# Patient Record
Sex: Female | Born: 1965 | Race: Black or African American | Hispanic: No | Marital: Married | Smoking: Never smoker
Health system: Southern US, Community
[De-identification: ages and names within clinical notes are randomized; demographics above are authoritative.]

## PROBLEM LIST (undated history)

## (undated) DIAGNOSIS — M5430 Sciatica, unspecified side: Secondary | ICD-10-CM

## (undated) DIAGNOSIS — G4730 Sleep apnea, unspecified: Secondary | ICD-10-CM

## (undated) DIAGNOSIS — I10 Essential (primary) hypertension: Secondary | ICD-10-CM

## (undated) HISTORY — PX: NO PAST SURGERIES: SHX2092

---

## 1997-08-29 ENCOUNTER — Other Ambulatory Visit: Admission: RE | Admit: 1997-08-29 | Discharge: 1997-08-29 | Payer: Self-pay | Admitting: Obstetrics & Gynecology

## 1997-08-29 ENCOUNTER — Other Ambulatory Visit: Admission: RE | Admit: 1997-08-29 | Discharge: 1997-08-29 | Payer: Self-pay

## 1998-09-14 ENCOUNTER — Emergency Department (HOSPITAL_COMMUNITY): Admission: EM | Admit: 1998-09-14 | Discharge: 1998-09-14 | Payer: Self-pay | Admitting: Emergency Medicine

## 1998-10-11 ENCOUNTER — Other Ambulatory Visit: Admission: RE | Admit: 1998-10-11 | Discharge: 1998-10-11 | Payer: Self-pay | Admitting: Obstetrics and Gynecology

## 1999-10-18 ENCOUNTER — Other Ambulatory Visit: Admission: RE | Admit: 1999-10-18 | Discharge: 1999-10-18 | Payer: Self-pay | Admitting: Family Medicine

## 2000-12-15 ENCOUNTER — Other Ambulatory Visit: Admission: RE | Admit: 2000-12-15 | Discharge: 2000-12-15 | Payer: Self-pay | Admitting: Family Medicine

## 2001-12-24 ENCOUNTER — Other Ambulatory Visit: Admission: RE | Admit: 2001-12-24 | Discharge: 2001-12-24 | Payer: Self-pay | Admitting: Obstetrics and Gynecology

## 2003-01-10 ENCOUNTER — Other Ambulatory Visit: Admission: RE | Admit: 2003-01-10 | Discharge: 2003-01-10 | Payer: Self-pay | Admitting: Obstetrics and Gynecology

## 2003-02-08 ENCOUNTER — Emergency Department (HOSPITAL_COMMUNITY): Admission: EM | Admit: 2003-02-08 | Discharge: 2003-02-08 | Payer: Self-pay | Admitting: Emergency Medicine

## 2003-07-26 ENCOUNTER — Ambulatory Visit (HOSPITAL_COMMUNITY): Admission: RE | Admit: 2003-07-26 | Discharge: 2003-07-26 | Payer: Self-pay | Admitting: Family Medicine

## 2004-04-23 ENCOUNTER — Other Ambulatory Visit: Admission: RE | Admit: 2004-04-23 | Discharge: 2004-04-23 | Payer: Self-pay | Admitting: Obstetrics and Gynecology

## 2004-09-05 ENCOUNTER — Ambulatory Visit (HOSPITAL_BASED_OUTPATIENT_CLINIC_OR_DEPARTMENT_OTHER): Admission: RE | Admit: 2004-09-05 | Discharge: 2004-09-05 | Payer: Self-pay | Admitting: Family Medicine

## 2004-09-09 ENCOUNTER — Ambulatory Visit: Payer: Self-pay | Admitting: Internal Medicine

## 2005-05-06 ENCOUNTER — Emergency Department (HOSPITAL_COMMUNITY): Admission: EM | Admit: 2005-05-06 | Discharge: 2005-05-07 | Payer: Self-pay | Admitting: Emergency Medicine

## 2005-05-20 ENCOUNTER — Other Ambulatory Visit: Admission: RE | Admit: 2005-05-20 | Discharge: 2005-05-20 | Payer: Self-pay | Admitting: Obstetrics and Gynecology

## 2005-06-18 ENCOUNTER — Encounter: Admission: RE | Admit: 2005-06-18 | Discharge: 2005-06-18 | Payer: Self-pay | Admitting: Family Medicine

## 2005-07-17 ENCOUNTER — Encounter: Admission: RE | Admit: 2005-07-17 | Discharge: 2005-07-17 | Payer: Self-pay | Admitting: Family Medicine

## 2006-08-18 ENCOUNTER — Encounter: Admission: RE | Admit: 2006-08-18 | Discharge: 2006-08-18 | Payer: Self-pay | Admitting: Family Medicine

## 2007-08-28 ENCOUNTER — Ambulatory Visit (HOSPITAL_BASED_OUTPATIENT_CLINIC_OR_DEPARTMENT_OTHER): Admission: RE | Admit: 2007-08-28 | Discharge: 2007-08-28 | Payer: Self-pay | Admitting: Obstetrics and Gynecology

## 2008-08-30 ENCOUNTER — Ambulatory Visit: Payer: Self-pay | Admitting: Diagnostic Radiology

## 2008-08-30 ENCOUNTER — Ambulatory Visit (HOSPITAL_BASED_OUTPATIENT_CLINIC_OR_DEPARTMENT_OTHER): Admission: RE | Admit: 2008-08-30 | Discharge: 2008-08-30 | Payer: Self-pay | Admitting: Family Medicine

## 2009-10-04 ENCOUNTER — Ambulatory Visit: Payer: Self-pay | Admitting: Diagnostic Radiology

## 2009-10-04 ENCOUNTER — Ambulatory Visit (HOSPITAL_BASED_OUTPATIENT_CLINIC_OR_DEPARTMENT_OTHER): Admission: RE | Admit: 2009-10-04 | Discharge: 2009-10-04 | Payer: Self-pay | Admitting: Family Medicine

## 2010-05-21 ENCOUNTER — Emergency Department (HOSPITAL_BASED_OUTPATIENT_CLINIC_OR_DEPARTMENT_OTHER)
Admission: EM | Admit: 2010-05-21 | Discharge: 2010-05-21 | Disposition: A | Discharge status: Home / Self Care | Payer: 59 | Attending: Emergency Medicine | Admitting: Emergency Medicine

## 2010-05-21 DIAGNOSIS — M109 Gout, unspecified: Secondary | ICD-10-CM | POA: Insufficient documentation

## 2010-05-21 DIAGNOSIS — I10 Essential (primary) hypertension: Secondary | ICD-10-CM | POA: Insufficient documentation

## 2010-06-29 NOTE — Procedures (Signed)
NAMEALLANNA, Jennifer Pugh                ACCOUNT NO.:  [id number]   MEDICAL RECORD NO.:  [id number]          PATIENT TYPE:  OUT   LOCATION:  SLEEP CENTER                 FACILITY:  [facility]   PHYSICIAN:  Clinton D. Maple Hudson, M.D. DATE OF BIRTH:  1965-12-01   DATE OF STUDY:  09/05/2004                              NOCTURNAL POLYSOMNOGRAM   REFERRING PHYSICIAN:  Leilani Able, MD   INDICATION FOR STUDY:  Hypersomnia with sleep apnea. Epworth sleepiness  score:  16/24. BMI:  54. Weight 300 pounds.   SLEEP ARCHITECTURE:  Total sleep time 354 minutes with sleep efficiency of  86%. Stage I was 8%, stage II 81%, stages III and IV 1%, REM 9% of total  sleep time. Sleep latency 11 minutes, REM latency 279 minutes, awake after  asleep onset 44 minutes, arousal index increased at 47. No bed time  medication taken.   RESPIRATORY DATA:  NPSG protocol requested.  Respiratory disturbance index  (RDI/AHI) 146 obstructive events per hour and very severe obstructive sleep  apnea/hypopnea syndrome. There were 840 obstructive apneas and 24 hypopneas.  The events were recorded while supine and on the left side which was her  preferred sleep position. REM RDI 110. C-PAP titration by split protocol had  not been requested.   OXYGEN DATA:  Very loud snoring with oxygen desaturation to a nadir of 53%.  An oxygen saturation through the study was 80% on room air. Oxygen was added  at one liter per minute, eventually increased to three liters per minute per  oxygen protocol. She still desaturated with apneas.   CARDIAC DATA:  Sinus bradycardia at normal sinus rhythm.   MOVEMENT/PARASOMNIA:  Occasional leg jerk, insignificant. Bathroom x2.   IMPRESSION/RECOMMENDATION:  1.  Extremely severe obstructive sleep apnea/hypopnea syndrome, respiratory      disturbance index 146.4 per hour with very loud snoring and oxygen      desaturation to 53%.  2.  Suggest early return for C-PAP titration or evaluate for alternative  therapy as is appropriate.  3.  Oxygen desaturation did not respond to supplemental oxygen because it      was due to apneas. It may be prudent      to repeat an overnight oximetry even after the patient is fitted with C-      PAP, to insure that the significant hypoxia is prevented.      Clinton D. Maple Hudson, M.D.  Diplomat    CDY/MEDQ  D:  09/09/2004 15:08:17  T:  09/10/2004 01:09:41  Job:  [id number]

## 2010-08-31 ENCOUNTER — Other Ambulatory Visit (HOSPITAL_BASED_OUTPATIENT_CLINIC_OR_DEPARTMENT_OTHER): Payer: Self-pay | Admitting: Obstetrics and Gynecology

## 2010-08-31 DIAGNOSIS — Z1231 Encounter for screening mammogram for malignant neoplasm of breast: Secondary | ICD-10-CM

## 2010-10-08 ENCOUNTER — Ambulatory Visit (HOSPITAL_BASED_OUTPATIENT_CLINIC_OR_DEPARTMENT_OTHER)
Admission: RE | Admit: 2010-10-08 | Discharge: 2010-10-08 | Disposition: A | Discharge status: Home / Self Care | Payer: 59 | Source: Ambulatory Visit | Attending: Obstetrics and Gynecology | Admitting: Obstetrics and Gynecology

## 2010-10-08 DIAGNOSIS — Z1231 Encounter for screening mammogram for malignant neoplasm of breast: Secondary | ICD-10-CM | POA: Insufficient documentation

## 2011-06-10 ENCOUNTER — Encounter (HOSPITAL_COMMUNITY): Payer: Self-pay | Admitting: Pharmacist

## 2011-06-18 ENCOUNTER — Encounter (HOSPITAL_COMMUNITY)
Admission: RE | Admit: 2011-06-18 | Discharge: 2011-06-18 | Disposition: A | Discharge status: Home / Self Care | Payer: 59 | Source: Ambulatory Visit | Attending: Obstetrics and Gynecology | Admitting: Obstetrics and Gynecology

## 2011-06-18 ENCOUNTER — Encounter (HOSPITAL_COMMUNITY): Payer: Self-pay

## 2011-06-18 HISTORY — DX: Sleep apnea, unspecified: G47.30

## 2011-06-18 HISTORY — DX: Essential (primary) hypertension: I10

## 2011-06-18 LAB — SURGICAL PCR SCREEN
MRSA, PCR: INVALID — AB
Staphylococcus aureus: INVALID — AB

## 2011-06-18 LAB — BASIC METABOLIC PANEL
BUN: 13 mg/dL (ref 6–23)
GFR calc Af Amer: >90 mL/min (ref >90)
GFR calc non Af Amer: 85 mL/min — ABNORMAL LOW (ref >90)
Potassium: 4.7 mEq/L (ref 3.5–5.1)
Sodium: 138 mEq/L (ref 135–145)

## 2011-06-18 LAB — CBC
Hemoglobin: 10.3 g/dL — ABNORMAL LOW (ref 12.0–15.0)
MCHC: 31.9 g/dL (ref 30.0–36.0)
RDW: 14 % (ref 11.5–15.5)

## 2011-06-18 NOTE — Patient Instructions (Signed)
YOUR PROCEDURE IS SCHEDULED ON:06/25/11  ENTER THROUGH THE MAIN ENTRANCE OF [facility] AT:8am  USE DESK PHONE AND DIAL [phone] TO INFORM us OF YOUR ARRIVAL  CALL [phone] IF YOU HAVE ANY QUESTIONS OR PROBLEMS PRIOR TO YOUR ARRIVAL.  REMEMBER: DO NOT EAT OR DRINK AFTER MIDNIGHT :Monday  SPECIAL INSTRUCTIONS:   YOU MAY BRUSH YOUR TEETH THE MORNING OF SURGERY   TAKE THESE MEDICINES THE DAY OF SURGERY WITH SIP OF WATER: BP med   DO NOT WEAR JEWELRY, EYE MAKEUP, LIPSTICK OR DARK FINGERNAIL POLISH DO NOT WEAR LOTIONS OR DEODORANT DO NOT SHAVE FOR 48 HOURS PRIOR TO SURGERY  YOU WILL NOT BE ALLOWED TO DRIVE YOURSELF HOME.  NAME OF DRIVER:Kenneth Pernell Dupre

## 2011-06-21 LAB — MRSA CULTURE

## 2011-06-24 MED ORDER — CEFAZOLIN SODIUM-DEXTROSE 2-3 GM-% IV SOLR
2.0000 g | INTRAVENOUS | Status: AC
  Administered 2011-06-25: 2 g via INTRAVENOUS
  Filled 2011-06-24: qty 50

## 2011-06-24 NOTE — H&P (Signed)
Jennifer Pugh is an 46 y.o. female. G2P0 who is coming in for a scheduled TAH given an enlarging fibroid uterus that has become more symptomatic over the last 2 years.  The patient reports cycles every 21-35 days that are extremely painful and heavy.  She had one episode of pain over the last few months that almost took her to the ER.  She has a know fibroid uterus that seems to be growing more rapidly over the last 2 years and is now >20 weeks in size.  She has no intermenstrual bleeding.  Pain and urine sx from the bulkiness.  Pertinent Gynecological History: No abnormal pap smears TAB x 2   Menstrual History: LMP 06/06/11     Past Medical History  Diagnosis Date  . Hypertension   . Sleep apnea     CPAP use every night    Past Surgical History  Procedure Date  . No past surgeries     No family history on file.  Social History:  reports that she has never smoked. She does not have any smokeless tobacco history on file. She reports that she does not drink alcohol or use illicit drugs.  Allergies: No Known Allergies  No prescriptions prior to admission    ROS  There were no vitals taken for this visit. Physical Exam  Constitutional: She is oriented to person, place, and time. She appears well-developed and well-nourished.  Cardiovascular: Normal rate and regular rhythm.   Respiratory: Effort normal and breath sounds normal.  GI: Soft.       Uterus palpable above umbilicus  Genitourinary: Vagina normal.       Bulky uterus R>L 20+ weeks size  Neurological: She is alert and oriented to person, place, and time.  Psychiatric: She has a normal mood and affect. Her behavior is normal.      Assessment/Plan: Pt counseled re: risks of bleeding, infection and possible damage to bowel and bladder.  Also d/w pt referral for possible robotic surgery--she declined.   She understands the risk of surgery and desires to proceed.  Oliver Pila 06/24/2011, 5:27 PM

## 2011-06-25 ENCOUNTER — Encounter (HOSPITAL_COMMUNITY): Payer: Self-pay | Admitting: Certified Registered Nurse Anesthetist

## 2011-06-25 ENCOUNTER — Encounter (HOSPITAL_COMMUNITY): Payer: Self-pay | Admitting: *Deleted

## 2011-06-25 ENCOUNTER — Inpatient Hospital Stay (HOSPITAL_COMMUNITY): Payer: 59 | Admitting: Certified Registered Nurse Anesthetist

## 2011-06-25 ENCOUNTER — Inpatient Hospital Stay (HOSPITAL_COMMUNITY)
Admission: RE | Admit: 2011-06-25 | Discharge: 2011-06-27 | DRG: 743 | Disposition: A | Discharge status: Home / Self Care | Payer: 59 | Source: Ambulatory Visit | Attending: Obstetrics and Gynecology | Admitting: Obstetrics and Gynecology

## 2011-06-25 ENCOUNTER — Encounter (HOSPITAL_COMMUNITY)
Admission: RE | Disposition: A | Discharge status: Home / Self Care | Payer: Self-pay | Source: Ambulatory Visit | Attending: Obstetrics and Gynecology

## 2011-06-25 DIAGNOSIS — N8000 Endometriosis of the uterus, unspecified: Secondary | ICD-10-CM | POA: Diagnosis present

## 2011-06-25 DIAGNOSIS — N920 Excessive and frequent menstruation with regular cycle: Secondary | ICD-10-CM | POA: Diagnosis present

## 2011-06-25 DIAGNOSIS — I10 Essential (primary) hypertension: Secondary | ICD-10-CM | POA: Diagnosis present

## 2011-06-25 DIAGNOSIS — D250 Submucous leiomyoma of uterus: Principal | ICD-10-CM | POA: Diagnosis present

## 2011-06-25 DIAGNOSIS — N946 Dysmenorrhea, unspecified: Secondary | ICD-10-CM | POA: Diagnosis present

## 2011-06-25 DIAGNOSIS — D251 Intramural leiomyoma of uterus: Secondary | ICD-10-CM | POA: Diagnosis present

## 2011-06-25 DIAGNOSIS — N831 Corpus luteum cyst of ovary, unspecified side: Secondary | ICD-10-CM | POA: Diagnosis present

## 2011-06-25 DIAGNOSIS — Z90710 Acquired absence of both cervix and uterus: Secondary | ICD-10-CM

## 2011-06-25 DIAGNOSIS — G4730 Sleep apnea, unspecified: Secondary | ICD-10-CM | POA: Diagnosis present

## 2011-06-25 DIAGNOSIS — N7013 Chronic salpingitis and oophoritis: Secondary | ICD-10-CM | POA: Diagnosis present

## 2011-06-25 HISTORY — PX: SUPRACERVICAL ABDOMINAL HYSTERECTOMY: SHX5393

## 2011-06-25 HISTORY — PX: SALPINGOOPHORECTOMY: SHX82

## 2011-06-25 LAB — TYPE AND SCREEN: ABO/RH(D): O POS

## 2011-06-25 LAB — ABO/RH: ABO/RH(D): O POS

## 2011-06-25 SURGERY — HYSTERECTOMY, SUPRACERVICAL, ABDOMINAL
Anesthesia: General | Site: Abdomen | Laterality: Right | Wound class: Clean

## 2011-06-25 MED ORDER — OXYCODONE-ACETAMINOPHEN 5-325 MG PO TABS
1.0000 | ORAL_TABLET | ORAL | Status: DC | PRN
  Administered 2011-06-26 (×2): 2 via ORAL
  Filled 2011-06-25 (×2): qty 2

## 2011-06-25 MED ORDER — HYDROMORPHONE HCL PF 1 MG/ML IJ SOLN
INTRAMUSCULAR | Status: AC
  Administered 2011-06-25: 0.5 mg via INTRAVENOUS
  Filled 2011-06-25: qty 1

## 2011-06-25 MED ORDER — DEXAMETHASONE SODIUM PHOSPHATE 4 MG/ML IJ SOLN
INTRAMUSCULAR | Status: DC | PRN
  Administered 2011-06-25 (×2): 5 mg via INTRAVENOUS

## 2011-06-25 MED ORDER — GLYCOPYRROLATE 0.2 MG/ML IJ SOLN
INTRAMUSCULAR | Status: AC
  Filled 2011-06-25: qty 1

## 2011-06-25 MED ORDER — EPHEDRINE SULFATE 50 MG/ML IJ SOLN
INTRAMUSCULAR | Status: DC | PRN
  Administered 2011-06-25: 10 mg via INTRAVENOUS

## 2011-06-25 MED ORDER — KETOROLAC TROMETHAMINE 30 MG/ML IJ SOLN
INTRAMUSCULAR | Status: AC
  Filled 2011-06-25: qty 1

## 2011-06-25 MED ORDER — GLYCOPYRROLATE 0.2 MG/ML IJ SOLN
INTRAMUSCULAR | Status: DC | PRN
  Administered 2011-06-25: .8 mg via INTRAVENOUS
  Administered 2011-06-25: .2 mg via INTRAVENOUS

## 2011-06-25 MED ORDER — ONDANSETRON HCL 4 MG/2ML IJ SOLN
INTRAMUSCULAR | Status: AC
  Filled 2011-06-25: qty 2

## 2011-06-25 MED ORDER — MAGNESIUM HYDROXIDE 400 MG/5ML PO SUSP
30.0000 mL | Freq: Every day | ORAL | Status: DC | PRN
  Filled 2011-06-25: qty 30

## 2011-06-25 MED ORDER — HYDROMORPHONE HCL PF 1 MG/ML IJ SOLN
INTRAMUSCULAR | Status: AC
  Filled 2011-06-25: qty 1

## 2011-06-25 MED ORDER — FENTANYL CITRATE 0.05 MG/ML IJ SOLN
INTRAMUSCULAR | Status: AC
  Filled 2011-06-25: qty 5

## 2011-06-25 MED ORDER — ONDANSETRON HCL 4 MG/2ML IJ SOLN: 4.0000 mg | Freq: Four times a day (QID) | INTRAMUSCULAR | Status: DC | PRN

## 2011-06-25 MED ORDER — NEOSTIGMINE METHYLSULFATE 1 MG/ML IJ SOLN
INTRAMUSCULAR | Status: DC | PRN
  Administered 2011-06-25: 5 mg via INTRAVENOUS

## 2011-06-25 MED ORDER — SODIUM CHLORIDE 0.9 % IJ SOLN: 9.0000 mL | INTRAMUSCULAR | Status: DC | PRN

## 2011-06-25 MED ORDER — LISINOPRIL 40 MG PO TABS
40.0000 mg | ORAL_TABLET | Freq: Every morning | ORAL | Status: DC
  Administered 2011-06-25 – 2011-06-27 (×3): 40 mg via ORAL
  Filled 2011-06-25 (×3): qty 1

## 2011-06-25 MED ORDER — KETOROLAC TROMETHAMINE 30 MG/ML IJ SOLN
INTRAMUSCULAR | Status: DC | PRN
  Administered 2011-06-25: 30 mg via INTRAVENOUS

## 2011-06-25 MED ORDER — MEPERIDINE HCL 25 MG/ML IJ SOLN: 6.2500 mg | INTRAMUSCULAR | Status: DC | PRN

## 2011-06-25 MED ORDER — NEOSTIGMINE METHYLSULFATE 1 MG/ML IJ SOLN
INTRAMUSCULAR | Status: AC
  Filled 2011-06-25: qty 10

## 2011-06-25 MED ORDER — ONDANSETRON HCL 4 MG PO TABS: 4.0000 mg | ORAL_TABLET | Freq: Four times a day (QID) | ORAL | Status: DC | PRN

## 2011-06-25 MED ORDER — IBUPROFEN 600 MG PO TABS
600.0000 mg | ORAL_TABLET | Freq: Four times a day (QID) | ORAL | Status: DC | PRN
  Administered 2011-06-26: 600 mg via ORAL
  Filled 2011-06-25: qty 1

## 2011-06-25 MED ORDER — SCOPOLAMINE 1 MG/3DAYS TD PT72
1.0000 | MEDICATED_PATCH | Freq: Once | TRANSDERMAL | Status: AC
  Administered 2011-06-25: 1 via TRANSDERMAL
  Administered 2011-06-25: 1.5 mg via TRANSDERMAL

## 2011-06-25 MED ORDER — HYDROMORPHONE 0.3 MG/ML IV SOLN
INTRAVENOUS | Status: DC
  Administered 2011-06-25: 9.33 mL via INTRAVENOUS
  Administered 2011-06-25: 4.79 mg via INTRAVENOUS
  Administered 2011-06-25: 14:00:00 via INTRAVENOUS
  Administered 2011-06-26: 0.599 mg via INTRAVENOUS
  Administered 2011-06-26: 0.8 mg via INTRAVENOUS
  Filled 2011-06-25: qty 25

## 2011-06-25 MED ORDER — SIMETHICONE 80 MG PO CHEW: 80.0000 mg | CHEWABLE_TABLET | Freq: Four times a day (QID) | ORAL | Status: DC | PRN

## 2011-06-25 MED ORDER — LIDOCAINE HCL (CARDIAC) 20 MG/ML IV SOLN
INTRAVENOUS | Status: AC
  Filled 2011-06-25: qty 5

## 2011-06-25 MED ORDER — DIPHENHYDRAMINE HCL 50 MG/ML IJ SOLN: 12.5000 mg | Freq: Four times a day (QID) | INTRAMUSCULAR | Status: DC | PRN

## 2011-06-25 MED ORDER — HYDROMORPHONE HCL PF 1 MG/ML IJ SOLN
0.2500 mg | INTRAMUSCULAR | Status: DC | PRN
  Administered 2011-06-25 (×2): 0.5 mg via INTRAVENOUS

## 2011-06-25 MED ORDER — METHYLENE BLUE 1 % INJ SOLN
INTRAMUSCULAR | Status: AC
  Filled 2011-06-25: qty 1

## 2011-06-25 MED ORDER — DIPHENHYDRAMINE HCL 12.5 MG/5ML PO ELIX
12.5000 mg | ORAL_SOLUTION | Freq: Four times a day (QID) | ORAL | Status: DC | PRN
  Filled 2011-06-25: qty 5

## 2011-06-25 MED ORDER — ROCURONIUM BROMIDE 100 MG/10ML IV SOLN
INTRAVENOUS | Status: DC | PRN
  Administered 2011-06-25: 60 mg via INTRAVENOUS

## 2011-06-25 MED ORDER — BISACODYL 10 MG RE SUPP
10.0000 mg | Freq: Every day | RECTAL | Status: DC | PRN
  Filled 2011-06-25: qty 1

## 2011-06-25 MED ORDER — MENTHOL 3 MG MT LOZG
1.0000 | LOZENGE | OROMUCOSAL | Status: DC | PRN
  Filled 2011-06-25: qty 9

## 2011-06-25 MED ORDER — METOCLOPRAMIDE HCL 5 MG/ML IJ SOLN: 10.0000 mg | Freq: Once | INTRAMUSCULAR | Status: DC | PRN

## 2011-06-25 MED ORDER — MIDAZOLAM HCL 2 MG/2ML IJ SOLN
INTRAMUSCULAR | Status: AC
  Filled 2011-06-25: qty 2

## 2011-06-25 MED ORDER — MIDAZOLAM HCL 5 MG/5ML IJ SOLN
INTRAMUSCULAR | Status: DC | PRN
  Administered 2011-06-25: .5 mg via INTRAVENOUS
  Administered 2011-06-25: 1.5 mg via INTRAVENOUS

## 2011-06-25 MED ORDER — BUPIVACAINE IN DEXTROSE 0.75-8.25 % IT SOLN
INTRATHECAL | Status: AC
  Filled 2011-06-25: qty 2

## 2011-06-25 MED ORDER — ONDANSETRON HCL 4 MG/2ML IJ SOLN
INTRAMUSCULAR | Status: DC | PRN
  Administered 2011-06-25 (×2): 2 mg via INTRAVENOUS

## 2011-06-25 MED ORDER — GLYCOPYRROLATE 0.2 MG/ML IJ SOLN
INTRAMUSCULAR | Status: AC
  Filled 2011-06-25: qty 2

## 2011-06-25 MED ORDER — FENTANYL CITRATE 0.05 MG/ML IJ SOLN
INTRAMUSCULAR | Status: DC | PRN
  Administered 2011-06-25: 100 ug via INTRAVENOUS
  Administered 2011-06-25: 50 ug via INTRAVENOUS
  Administered 2011-06-25 (×2): 100 ug via INTRAVENOUS
  Administered 2011-06-25: 50 ug via INTRAVENOUS

## 2011-06-25 MED ORDER — LIDOCAINE HCL (CARDIAC) 20 MG/ML IV SOLN
INTRAVENOUS | Status: DC | PRN
  Administered 2011-06-25: 100 mg via INTRAVENOUS

## 2011-06-25 MED ORDER — DOCUSATE SODIUM 100 MG PO CAPS
100.0000 mg | ORAL_CAPSULE | Freq: Two times a day (BID) | ORAL | Status: DC
  Administered 2011-06-25 – 2011-06-26 (×3): 100 mg via ORAL
  Filled 2011-06-25 (×7): qty 1

## 2011-06-25 MED ORDER — LACTATED RINGERS IV SOLN
INTRAVENOUS | Status: DC
  Administered 2011-06-25: 100 mL/h via INTRAVENOUS
  Administered 2011-06-25 (×2): via INTRAVENOUS

## 2011-06-25 MED ORDER — DEXTROSE-NACL 5-0.45 % IV SOLN
INTRAVENOUS | Status: DC
  Administered 2011-06-25 – 2011-06-26 (×3): via INTRAVENOUS

## 2011-06-25 MED ORDER — ROCURONIUM BROMIDE 50 MG/5ML IV SOLN
INTRAVENOUS | Status: AC
  Filled 2011-06-25: qty 2

## 2011-06-25 MED ORDER — PROPOFOL 10 MG/ML IV EMUL
INTRAVENOUS | Status: DC | PRN
  Administered 2011-06-25: 300 mg via INTRAVENOUS

## 2011-06-25 MED ORDER — NALOXONE HCL 0.4 MG/ML IJ SOLN: 0.4000 mg | INTRAMUSCULAR | Status: DC | PRN

## 2011-06-25 MED ORDER — PROPOFOL 10 MG/ML IV EMUL
INTRAVENOUS | Status: AC
  Filled 2011-06-25: qty 40

## 2011-06-25 MED ORDER — HYDROMORPHONE HCL PF 1 MG/ML IJ SOLN
INTRAMUSCULAR | Status: DC | PRN
  Administered 2011-06-25: 1 mg via INTRAVENOUS

## 2011-06-25 MED ORDER — EPHEDRINE 5 MG/ML INJ
INTRAVENOUS | Status: AC
  Filled 2011-06-25: qty 10

## 2011-06-25 MED ORDER — SCOPOLAMINE 1 MG/3DAYS TD PT72
MEDICATED_PATCH | TRANSDERMAL | Status: AC
  Administered 2011-06-25: 1.5 mg via TRANSDERMAL
  Filled 2011-06-25: qty 1

## 2011-06-25 MED ORDER — DEXAMETHASONE SODIUM PHOSPHATE 10 MG/ML IJ SOLN
INTRAMUSCULAR | Status: AC
  Filled 2011-06-25: qty 1

## 2011-06-25 SURGICAL SUPPLY — 40 items
APL SKNCLS STERI-STRIP NONHPOA (GAUZE/BANDAGES/DRESSINGS) ×3
BARRIER ADHS 3X4 INTERCEED (GAUZE/BANDAGES/DRESSINGS) ×2 IMPLANT
BENZOIN TINCTURE PRP APPL 2/3 (GAUZE/BANDAGES/DRESSINGS) ×2 IMPLANT
BRR ADH 4X3 ABS CNTRL BYND (GAUZE/BANDAGES/DRESSINGS) ×3
CANISTER SUCTION 2500CC (MISCELLANEOUS) ×4 IMPLANT
CHLORAPREP W/TINT 26ML (MISCELLANEOUS) ×2 IMPLANT
CLOTH BEACON ORANGE TIMEOUT ST (SAFETY) ×4 IMPLANT
CONT PATH 16OZ SNAP LID 3702 (MISCELLANEOUS) ×4 IMPLANT
DECANTER SPIKE VIAL GLASS SM (MISCELLANEOUS) IMPLANT
DRSG COVADERM 4X10 (GAUZE/BANDAGES/DRESSINGS) ×2 IMPLANT
GLOVE BIO SURGEON STRL SZ 6.5 (GLOVE) ×8 IMPLANT
GOWN PREVENTION PLUS LG XLONG (DISPOSABLE) ×8 IMPLANT
GOWN PREVENTION PLUS XLARGE (GOWN DISPOSABLE) ×4 IMPLANT
NDL HYPO 25X1 1.5 SAFETY (NEEDLE) IMPLANT
NEEDLE HYPO 25X1 1.5 SAFETY (NEEDLE) IMPLANT
NS IRRIG 1000ML POUR BTL (IV SOLUTION) ×4 IMPLANT
PACK ABDOMINAL GYN (CUSTOM PROCEDURE TRAY) ×4 IMPLANT
PAD OB MATERNITY 4.3X12.25 (PERSONAL CARE ITEMS) ×4 IMPLANT
PROTECTOR NERVE ULNAR (MISCELLANEOUS) ×4 IMPLANT
RETRACTOR WND ALEXIS 25 LRG (MISCELLANEOUS) ×2 IMPLANT
RTRCTR C-SECT PINK 25CM LRG (MISCELLANEOUS) ×2 IMPLANT
RTRCTR WOUND ALEXIS 25CM LRG (MISCELLANEOUS)
SPONGE LAP 18X18 X RAY DECT (DISPOSABLE) ×4 IMPLANT
STAPLER VISISTAT 35W (STAPLE) ×2 IMPLANT
STRIP CLOSURE SKIN 1/2X4 (GAUZE/BANDAGES/DRESSINGS) ×2 IMPLANT
SUT CHROMIC 0 CTX 36 (SUTURE) ×4 IMPLANT
SUT PDS AB 0 CTX 60 (SUTURE) IMPLANT
SUT PLAIN 2 0 XLH (SUTURE) IMPLANT
SUT VIC AB 0 CT1 36 (SUTURE) ×8 IMPLANT
SUT VIC AB 2-0 CT1 27 (SUTURE) ×4
SUT VIC AB 2-0 CT1 TAPERPNT 27 (SUTURE) ×1 IMPLANT
SUT VIC AB 3-0 SH 27 (SUTURE)
SUT VIC AB 3-0 SH 27X BRD (SUTURE) IMPLANT
SUT VIC AB 4-0 KS 27 (SUTURE) ×4 IMPLANT
SUT VICRYL #0 CTB 1 (SUTURE) ×10 IMPLANT
SUT VICRYL 0 TIES 12 18 (SUTURE) ×4 IMPLANT
SYR CONTROL 10ML LL (SYRINGE) IMPLANT
TOWEL OR 17X24 6PK STRL BLUE (TOWEL DISPOSABLE) ×8 IMPLANT
TRAY FOLEY CATH 14FR (SET/KITS/TRAYS/PACK) ×4 IMPLANT
WATER STERILE IRR 1000ML POUR (IV SOLUTION) ×2 IMPLANT

## 2011-06-25 NOTE — Brief Op Note (Signed)
06/25/2011  11:40 AM  PATIENT:  Jennifer Pugh  45 y.o. female  PRE-OPERATIVE DIAGNOSIS:  fibroids, pelvic pain  POST-OPERATIVE DIAGNOSIS:  fibroids, pelvic pain, large right hydrosalpinx with ovary densely adherent  PROCEDURE:  Procedure(s) (LRB): HYSTERECTOMY SUPRACERVICAL ABDOMINAL () SALPINGO OOPHERECTOMY (Right)  SURGEON:  Surgeon(s) and Role: Panel 1:    * Oliver Pila, MD - Primary  Panel 2:   Lavina Hamman, MD-assist   ANESTHESIA:   general  EBL:  Total I/O In: 2000 [I.V.:2000] Out: 450 [Urine:150; Blood:300]  BLOOD ADMINISTERED:none  DRAINS: Urinary Catheter (Foley)   LOCAL MEDICATIONS USED:  NONE  SPECIMEN:  Uterus, right tube and ovary  DISPOSITION OF SPECIMEN:  PATHOLOGY  COUNTS:  YES   DICTATION: .Dragon Dictation  PLAN OF CARE: Admit to inpatient   PATIENT DISPOSITION:  PACU - hemodynamically stable.

## 2011-06-25 NOTE — Anesthesia Postprocedure Evaluation (Signed)
  Anesthesia Post-op Note  Patient: Landscape architect  Procedure(s) Performed: Procedure(s) (LRB): HYSTERECTOMY SUPRACERVICAL ABDOMINAL () SALPINGO OOPHERECTOMY (Right)  Patient Location: PACU and Women's Unit  Anesthesia Type: General  Level of Consciousness: awake, alert  and oriented  Airway and Oxygen Therapy: Patient Spontanous Breathing and Patient connected to nasal cannula oxygen  Post-op Pain: mild  Post-op Assessment: Patient's Cardiovascular Status Stable, Respiratory Function Stable, No signs of Nausea or vomiting and Pain level controlled  Post-op Vital Signs: stable  Complications: No apparent anesthesia complications

## 2011-06-25 NOTE — Anesthesia Procedure Notes (Signed)
Procedure Name: Intubation Date/Time: 06/25/2011 9:38 AM Performed by: Harriett Rush, Chazlyn Cude A Pre-anesthesia Checklist: Patient identified, Emergency Drugs available, Suction available, Patient being monitored and Timeout performed Patient Re-evaluated:Patient Re-evaluated prior to inductionOxygen Delivery Method: Circle system utilized Preoxygenation: Pre-oxygenation with 100% oxygen Intubation Type: IV induction Ventilation: Mask ventilation without difficulty Laryngoscope Size: Miller and 2 Grade View: Grade I Tube type: Oral Tube size: 7.0 mm Number of attempts: 1 Placement Confirmation: ETT inserted through vocal cords under direct vision,  positive ETCO2 and breath sounds checked- equal and bilateral Secured at: 22 cm Tube secured with: Tape Dental Injury: Teeth and Oropharynx as per pre-operative assessment

## 2011-06-25 NOTE — Anesthesia Preprocedure Evaluation (Addendum)
Anesthesia Evaluation  Patient identified by MRN, date of birth, ID band Patient awake    Reviewed: Allergy & Precautions, H&P , NPO status , Patient's Chart, lab work & pertinent test results  Airway Mallampati: I TM Distance: >3 FB Neck ROM: Full    Dental No notable dental hx. (+) Teeth Intact   Pulmonary sleep apnea and Continuous Positive Airway Pressure Ventilation ,  breath sounds clear to auscultation  Pulmonary exam normal       Cardiovascular hypertension, Pt. on medications Rhythm:Regular Rate:Normal     Neuro/Psych negative neurological ROS  negative psych ROS   GI/Hepatic negative GI ROS, Neg liver ROS,   Endo/Other  Morbid obesity  Renal/GU negative Renal ROS  negative genitourinary   Musculoskeletal negative musculoskeletal ROS (+)   Abdominal (+) + obese,  Abdomen: soft.    Peds  Hematology negative hematology ROS (+)   Anesthesia Other Findings   Reproductive/Obstetrics negative OB ROS                          Anesthesia Physical Anesthesia Plan  ASA: III  Anesthesia Plan: General   Post-op Pain Management:    Induction: Intravenous  Airway Management Planned: Oral ETT  Additional Equipment:   Intra-op Plan:   Post-operative Plan: Extubation in OR  Informed Consent: I have reviewed the patients History and Physical, chart, labs and discussed the procedure including the risks, benefits and alternatives for the proposed anesthesia with the patient or authorized representative who has indicated his/her understanding and acceptance.   Dental advisory given  Plan Discussed with: CRNA, Anesthesiologist and Surgeon  Anesthesia Plan Comments:         Anesthesia Quick Evaluation

## 2011-06-25 NOTE — Op Note (Signed)
Operative report  Preoperative diagnosis Large fibroid uterus greater than 20 weeks size Dysmenorrhea Menorrhagia  Postoperative diagnosis Same with right hydrosalpinx and densely adherent right ovary.  Procedure Supracervical abdominal hysterectomy and right salpingo-oophorectomy  Surgeon Dr. Huel Cote Asst. Dr. Lavina Hamman  Anesthesia  General  Findings The uterus was markedly enlarged and distorted with several large fibroids measuring greater than 6-7 cm each. It was difficult to tell where the normal uterine cavity was even with postoperative inspection and opening of the uterus. The right ovary and tube were abnormal with dense adhesions to the pelvic sidewall. The right tube was markedly enlarged with a hydrosalpinx and the ovary was adherent to the tube densely. The left ovary and tube appeared normal. The pelvis and cul-de-sac had numerous adhesions that were filmy between the uterus and the peritoneal sidewalls.  Fluids Estimated blood loss 300 cc Urine output 150 cc clear urine IV fluid 2500 cc LR  Specimen The uterus and right tube and ovary were sent to pathology  Procedure note Patient was taken to the operating room where general anesthesia was obtained without difficulty after informed consent was obtained. She was then prepped and draped in the normal sterile fashion in the dorsal supine position. An appropriate time out was performed and a Pfannenstiel skin incision made with the scalpel and carried through to the underlying layer of fascia by sharp dissection and Bovie cautery. The fascia was nicked in the midline and the incision was extended laterally with Mayo scissors. The inferior aspect of the incision was then grasped Coker clamps and dissected off the underlying rectus muscles with Bovie cautery. In a similar fashion the superior aspect was dissected off the rectus muscles. The rectus muscles were then separated in the midline and the peritoneal  cavity entered bluntly. The large bulky fibroid uterus was encountered and a Alexis self-retaining retractor was placed within the incision. The large fibroid at the superior fundus was grasped and elevated through the incision and this was pulled up to the level of the round ligaments bilaterally. The round ligaments were then taken down with Bovie cautery and the retroperitoneal space partially opened. The utero-ovarian ligament was then isolated and taken down bilaterally with parametrial clamps and suture ligated with 0 Vicryl suture. The bladder flap was very distal in the pelvis and a superficial flap was made over the lower uterine segment fibroid and pushed away to allow better visualization in the pelvis. There were many filmy adhesions of the ovaries and tubes bilaterally to the posterior peritoneum and these were taken down with Bovie cautery the bowel was packed away with moist laparotomy sponges. A towel clip was then utilized to grasp the right sided fibroid and this was in the lower uterine segment. Once this was elevated through the incision and the peritoneal tissue peeled away from the fibroid itself. This was taken down to the level of the cervix. At this point the uterine vessels on the right were visualized and clamped with a parametrial clamp and suture-ligated x2.  Attention was then turned to the left side which was likewise elevated with visualization of the uterine vessels these were clamped with parametrial clamp transected and suture ligated x2. At this point the uterus was re\re oriented as it had become twisted however it was quite difficult to determine any normal uterine fundus as most of the large fibroids were in the lower uterine segment. The cervix itself was difficult to palpate and appeared to be pulled away from these lower  segment fibroids. At this point a supracervical hysterectomy was performed with parametrial clamps across the cervical stump at each step suture ligated  with 0 Vicryl. The uterine fundus was then amputated and carefully inspected and. Even with the fundus free it was difficult to visualize the true cavity. The uterus was opened with a scalpel and a cavity identified but was embedded within the fibroids and a clear channel to the cervix was not visualized. At this point the pelvis was year gaited and no active bleeding noted. Attention was returned to the right ovary and tube were a large hydrosalpinx was freed up from the pelvic sidewall. The right ovary itself was abnormal in appearance with dense adhesions to the hydrosalpinx and no real plain of separation identifiable. For this reason the decision was made to proceed with a right salpingo-oophorectomy to remove the diseased fallopian tube. The infundibulopelvic ligament was isolated and clamped with a parametrial clamp. It was then transected and suture ligated x2 with 0 Vicryl. The right ovary and tube were handed off to pathology. The left ovary and tube appeared normal except for some filmy adhesions to the cul-de-sac which were easily taken down. The cul-de-sac was not well developed secondary to her adhesions but was freed up as much as possible. At this point the ureters were palpated bilaterally and away from any pedicles and appeared normal in caliber. The cervical stump was inspected and several interrupted sutures were placed across the cervical stump to close it off. No attempt was made at removal because it was still quite difficult to define the paracervical planes and the patient preoperatively had not expressed a definitive desire for cervical removal. The pelvis was then copiously irrigated and no active bleeding noted. Interceed was placed over the cervical area to prevent re\re formation of adhesions. The Alexis retractor was removed and all laparotomy sponges removed with a correct count noted. The rectus muscles and peritoneum were then reapproximated with 2-0 Vicryl in several interrupted  mattress sutures. The subfascial planes were inspected and found to be hemostatic therefore the fascia was closed with 0 Vicryl in a running fashion. The subcutaneous tissue was reapproximated with 30 plain in running fashion. Skin was closed with subcuticular stitch of 4-0 Vicryl on a Keith needle. Sponge lap and needle counts were correct x2 and the patient was taken to the PACU in good condition.

## 2011-06-25 NOTE — Addendum Note (Signed)
Addendum  created 06/25/11 1628 by Elbert Ewings, CRNA   Modules edited:Notes Section

## 2011-06-25 NOTE — Progress Notes (Signed)
Patient ID: Jennifer Pugh, female   DOB: 03-27-1965, 46 y.o.   MRN: [id number] Per pt no changes have occurred in dictated H&P and brief exam WNL.  Pt's questions answered and ready to proceed.

## 2011-06-25 NOTE — Transfer of Care (Signed)
Immediate Anesthesia Transfer of Care Note  Patient: Jennifer Pugh  Procedure(s) Performed: Procedure(s) (LRB): HYSTERECTOMY SUPRACERVICAL ABDOMINAL () SALPINGO OOPHERECTOMY (Right)  Patient Location: PACU  Anesthesia Type: General  Level of Consciousness: awake, alert  and oriented  Airway & Oxygen Therapy: Patient Spontanous Breathing and Patient connected to nasal cannula oxygen  Post-op Assessment: Report given to PACU RN, Post -op Vital signs reviewed and stable and Patient moving all extremities X 4  Post vital signs: Reviewed and stable  Complications: No apparent anesthesia complications

## 2011-06-25 NOTE — Anesthesia Postprocedure Evaluation (Signed)
Anesthesia Post Note  Patient: Landscape architect  Procedure(s) Performed: Procedure(s) (LRB): HYSTERECTOMY SUPRACERVICAL ABDOMINAL () SALPINGO OOPHERECTOMY (Right)  Anesthesia type: GA  Patient location: PACU  Post pain: Pain level controlled  Post assessment: Post-op Vital signs reviewed  Last Vitals:  Filed Vitals:   06/25/11 1200  BP: 142/69  Pulse: 68  Temp:   Resp: 15    Post vital signs: Reviewed  Level of consciousness: sedated  Complications: No apparent anesthesia complications

## 2011-06-25 NOTE — Progress Notes (Signed)
Patient ID: Jennifer Pugh, female   DOB: [birth year], 46 y.o.   MRN: [id number] PT IS AWAKE AND ALERT VS ARE STABLE OUTPUT IS GOOD AND HER ABDOMEN IS SOFT AND NOT TENDER. THE URINE IS CLEAR AND LIGHT YELLOW.

## 2011-06-26 ENCOUNTER — Encounter (HOSPITAL_COMMUNITY): Payer: Self-pay | Admitting: Obstetrics and Gynecology

## 2011-06-26 LAB — CBC
HCT: 31.3 % — ABNORMAL LOW (ref 36.0–46.0)
Hemoglobin: 10.3 g/dL — ABNORMAL LOW (ref 12.0–15.0)
MCV: 84.8 fL (ref 78.0–100.0)
RBC: 3.69 MIL/uL — ABNORMAL LOW (ref 3.87–5.11)
RDW: 13.8 % (ref 11.5–15.5)
WBC: 16.4 10*3/uL — ABNORMAL HIGH (ref 4.0–10.5)

## 2011-06-26 LAB — BASIC METABOLIC PANEL
BUN: 6 mg/dL (ref 6–23)
CO2: 20 mEq/L (ref 19–32)
Chloride: 104 mEq/L (ref 96–112)
GFR calc Af Amer: >90 mL/min (ref >90)
Potassium: 4.7 mEq/L (ref 3.5–5.1)

## 2011-06-26 NOTE — Progress Notes (Signed)
1 Day Post-Op Procedure(s) (LRB): HYSTERECTOMY SUPRACERVICAL ABDOMINAL () SALPINGO OOPHERECTOMY (Right)  Subjective: Patient reports tolerating PO.  She has had no nausea and just got foley out 2 hours ago.  Objective: I have reviewed patient's vital signs, intake and output, medications and labs.  General: alert Abdomen soft NT Incision C/D/I  Assessment: s/p Procedure(s) (LRB): HYSTERECTOMY SUPRACERVICAL ABDOMINAL () SALPINGO OOPHERECTOMY (Right): stable  Plan: Discontinue IV fluids D/c PCA Will work on increasing ambulation  LOS: 1 day    Jennifer Pugh W 06/26/2011, 10:00 AM

## 2011-06-26 NOTE — Progress Notes (Signed)
UR chart review completed.  

## 2011-06-27 MED ORDER — OXYCODONE-ACETAMINOPHEN 5-325 MG PO TABS: 1.0000 | ORAL_TABLET | ORAL | Status: AC | PRN

## 2011-06-27 MED ORDER — IBUPROFEN 600 MG PO TABS: 600.0000 mg | ORAL_TABLET | Freq: Four times a day (QID) | ORAL | Status: AC | PRN

## 2011-06-27 NOTE — Progress Notes (Signed)
Pt out in w/c  Teaching complete  

## 2011-06-27 NOTE — Progress Notes (Signed)
2 Days Post-Op Procedure(s) (LRB): HYSTERECTOMY SUPRACERVICAL ABDOMINAL () SALPINGO OOPHERECTOMY (Right)  Subjective: Patient reports tolerating PO, + flatus and no problems voiding.    Objective: I have reviewed patient's vital signs and intake and output.  General: alert Resp: clear to auscultation bilaterally Cardio: regular rate and rhythm GI: soft NT Incision healing well  Assessment: s/p Procedure(s) (LRB): HYSTERECTOMY SUPRACERVICAL ABDOMINAL () SALPINGO OOPHERECTOMY (Right): progressing well  Plan: Discharge home Motrin and percocet F/u 2 weeks for incision check  LOS: 2 days    Jennifer Pugh W 06/27/2011, 8:41 AM

## 2011-06-27 NOTE — Discharge Summary (Signed)
Physician Discharge Summary  Patient ID: Jennifer Pugh MRN: [id number] DOB/AGE: 1965/05/19 45 y.o.  Admit date: 06/25/2011 Discharge date: 06/27/2011  Admission Diagnoses: Bulky Fibroid uterus Dysmenorrhea Menorrhagia  Discharge Diagnoses:  Same with pelvic adhesions and right hydrosalpinx S/p supracervical TAH and RSO  Discharged Condition: good  Hospital Course: Pt admitted for routine post-op care s/p supracervical hysterectomy and RSO.  She did well and post op hgb was 10.3.  On postop day #2 she was ambulating, tolerating a regular diet and voiding well.  Discharge Exam: Blood pressure 129/78, pulse 76, temperature 98 F (36.7 C), temperature source Oral, resp. rate 18, height 5' 2.5" (1.588 m), weight 132.45 kg (292 lb), last menstrual period 06/18/2011, SpO2 98.00%. General appearance: alert and cooperative Resp: clear to auscultation bilaterally Cardio: regular rate and rhythm GI: soft NT with incision well-approximated  Disposition: 01-Home or Self Care  Discharge Orders    Future Orders Please Complete By Expires   Increase activity slowly      Discharge instructions      Comments:   Nothing in vagina for 6 weeks.  Avoid driving for at least 1-2 weeks or until off narcotic pain meds.    Shower over incision and pat dry.   Call MD for:  temperature >100.4      Call MD for:  persistant nausea and vomiting        Medication List  As of 06/27/2011  9:07 AM   TAKE these medications         ibuprofen 600 MG tablet   Commonly known as: ADVIL,MOTRIN   Take 1 tablet (600 mg total) by mouth every 6 (six) hours as needed (mild pain).      lisinopril 40 MG tablet   Commonly known as: PRINIVIL,ZESTRIL   Take 40 mg by mouth every morning.      oxyCODONE-acetaminophen 5-325 MG per tablet   Commonly known as: PERCOCET   Take 1-2 tablets by mouth every 3 (three) hours as needed (moderate to severe pain (when tolerating fluids)).      triamterene-hydrochlorothiazide  37.5-25 MG per tablet   Commonly known as: MAXZIDE-25   Take 1 tablet by mouth every morning.             SignedOliver Pila 06/27/2011, 9:07 AM

## 2011-10-31 ENCOUNTER — Other Ambulatory Visit (HOSPITAL_BASED_OUTPATIENT_CLINIC_OR_DEPARTMENT_OTHER): Payer: Self-pay | Admitting: Family Medicine

## 2011-10-31 DIAGNOSIS — Z1231 Encounter for screening mammogram for malignant neoplasm of breast: Secondary | ICD-10-CM

## 2011-11-05 ENCOUNTER — Ambulatory Visit (HOSPITAL_BASED_OUTPATIENT_CLINIC_OR_DEPARTMENT_OTHER)
Admission: RE | Admit: 2011-11-05 | Discharge: 2011-11-05 | Disposition: A | Discharge status: Home / Self Care | Payer: 59 | Source: Ambulatory Visit | Attending: Family Medicine | Admitting: Family Medicine

## 2011-11-05 DIAGNOSIS — Z1231 Encounter for screening mammogram for malignant neoplasm of breast: Secondary | ICD-10-CM

## 2012-11-24 ENCOUNTER — Other Ambulatory Visit (HOSPITAL_BASED_OUTPATIENT_CLINIC_OR_DEPARTMENT_OTHER): Payer: Self-pay | Admitting: Nurse Practitioner

## 2012-11-24 DIAGNOSIS — Z1231 Encounter for screening mammogram for malignant neoplasm of breast: Secondary | ICD-10-CM

## 2012-12-02 ENCOUNTER — Ambulatory Visit (HOSPITAL_BASED_OUTPATIENT_CLINIC_OR_DEPARTMENT_OTHER)
Admission: RE | Admit: 2012-12-02 | Discharge: 2012-12-02 | Disposition: A | Discharge status: Home / Self Care | Payer: 59 | Source: Ambulatory Visit | Attending: Nurse Practitioner | Admitting: Nurse Practitioner

## 2012-12-02 DIAGNOSIS — Z1231 Encounter for screening mammogram for malignant neoplasm of breast: Secondary | ICD-10-CM | POA: Insufficient documentation

## 2013-12-21 ENCOUNTER — Other Ambulatory Visit (HOSPITAL_BASED_OUTPATIENT_CLINIC_OR_DEPARTMENT_OTHER): Payer: Self-pay | Admitting: *Deleted

## 2013-12-21 DIAGNOSIS — Z1231 Encounter for screening mammogram for malignant neoplasm of breast: Secondary | ICD-10-CM

## 2014-01-18 ENCOUNTER — Ambulatory Visit (HOSPITAL_BASED_OUTPATIENT_CLINIC_OR_DEPARTMENT_OTHER)
Admission: RE | Admit: 2014-01-18 | Discharge: 2014-01-18 | Disposition: A | Discharge status: Home / Self Care | Payer: 59 | Source: Ambulatory Visit | Attending: Family Medicine | Admitting: Family Medicine

## 2014-01-18 DIAGNOSIS — Z1231 Encounter for screening mammogram for malignant neoplasm of breast: Secondary | ICD-10-CM | POA: Insufficient documentation

## 2015-01-25 ENCOUNTER — Other Ambulatory Visit (HOSPITAL_BASED_OUTPATIENT_CLINIC_OR_DEPARTMENT_OTHER): Payer: Self-pay | Admitting: *Deleted

## 2015-01-25 ENCOUNTER — Other Ambulatory Visit (HOSPITAL_BASED_OUTPATIENT_CLINIC_OR_DEPARTMENT_OTHER): Payer: Self-pay | Admitting: Nurse Practitioner

## 2015-01-25 DIAGNOSIS — Z1231 Encounter for screening mammogram for malignant neoplasm of breast: Secondary | ICD-10-CM

## 2015-01-31 ENCOUNTER — Ambulatory Visit (HOSPITAL_BASED_OUTPATIENT_CLINIC_OR_DEPARTMENT_OTHER)
Admission: RE | Admit: 2015-01-31 | Discharge: 2015-01-31 | Disposition: A | Discharge status: Home / Self Care | Payer: 59 | Source: Ambulatory Visit | Attending: Family Medicine | Admitting: Family Medicine

## 2015-01-31 DIAGNOSIS — Z1231 Encounter for screening mammogram for malignant neoplasm of breast: Secondary | ICD-10-CM

## 2016-02-22 ENCOUNTER — Other Ambulatory Visit (HOSPITAL_BASED_OUTPATIENT_CLINIC_OR_DEPARTMENT_OTHER): Payer: Self-pay | Admitting: Nurse Practitioner

## 2016-02-22 DIAGNOSIS — Z1231 Encounter for screening mammogram for malignant neoplasm of breast: Secondary | ICD-10-CM

## 2016-02-26 ENCOUNTER — Ambulatory Visit (HOSPITAL_BASED_OUTPATIENT_CLINIC_OR_DEPARTMENT_OTHER)
Admission: RE | Admit: 2016-02-26 | Discharge: 2016-02-26 | Disposition: A | Discharge status: Home / Self Care | Payer: 59 | Source: Ambulatory Visit | Attending: Nurse Practitioner | Admitting: Nurse Practitioner

## 2016-02-26 DIAGNOSIS — R922 Inconclusive mammogram: Secondary | ICD-10-CM | POA: Insufficient documentation

## 2016-02-26 DIAGNOSIS — Z1231 Encounter for screening mammogram for malignant neoplasm of breast: Secondary | ICD-10-CM | POA: Diagnosis present

## 2016-02-29 ENCOUNTER — Other Ambulatory Visit: Payer: Self-pay | Admitting: Nurse Practitioner

## 2016-02-29 DIAGNOSIS — R928 Other abnormal and inconclusive findings on diagnostic imaging of breast: Secondary | ICD-10-CM

## 2016-04-01 ENCOUNTER — Ambulatory Visit
Admission: RE | Admit: 2016-04-01 | Discharge: 2016-04-01 | Disposition: A | Discharge status: Home / Self Care | Payer: 59 | Source: Ambulatory Visit | Attending: Nurse Practitioner | Admitting: Nurse Practitioner

## 2016-04-01 DIAGNOSIS — R928 Other abnormal and inconclusive findings on diagnostic imaging of breast: Secondary | ICD-10-CM

## 2017-04-29 ENCOUNTER — Emergency Department (HOSPITAL_BASED_OUTPATIENT_CLINIC_OR_DEPARTMENT_OTHER): Payer: 59

## 2017-04-29 ENCOUNTER — Encounter (HOSPITAL_BASED_OUTPATIENT_CLINIC_OR_DEPARTMENT_OTHER): Payer: Self-pay | Admitting: *Deleted

## 2017-04-29 ENCOUNTER — Other Ambulatory Visit: Payer: Self-pay

## 2017-04-29 ENCOUNTER — Emergency Department (HOSPITAL_BASED_OUTPATIENT_CLINIC_OR_DEPARTMENT_OTHER)
Admission: EM | Admit: 2017-04-29 | Discharge: 2017-04-29 | Disposition: A | Discharge status: Home / Self Care | Payer: 59 | Attending: Emergency Medicine | Admitting: Emergency Medicine

## 2017-04-29 DIAGNOSIS — I10 Essential (primary) hypertension: Secondary | ICD-10-CM | POA: Diagnosis not present

## 2017-04-29 DIAGNOSIS — M5441 Lumbago with sciatica, right side: Secondary | ICD-10-CM | POA: Diagnosis not present

## 2017-04-29 DIAGNOSIS — M47816 Spondylosis without myelopathy or radiculopathy, lumbar region: Secondary | ICD-10-CM | POA: Insufficient documentation

## 2017-04-29 DIAGNOSIS — M545 Low back pain: Secondary | ICD-10-CM | POA: Diagnosis present

## 2017-04-29 DIAGNOSIS — Z79899 Other long term (current) drug therapy: Secondary | ICD-10-CM | POA: Diagnosis not present

## 2017-04-29 DIAGNOSIS — M5431 Sciatica, right side: Secondary | ICD-10-CM

## 2017-04-29 HISTORY — DX: Sciatica, unspecified side: M54.30

## 2017-04-29 MED ORDER — OXYCODONE-ACETAMINOPHEN 5-325 MG PO TABS: 1.0000 | ORAL_TABLET | ORAL | 0 refills | Status: DC | PRN

## 2017-04-29 MED ORDER — MORPHINE SULFATE (PF) 4 MG/ML IV SOLN
4.0000 mg | Freq: Once | INTRAVENOUS | Status: AC
  Administered 2017-04-29: 4 mg via INTRAMUSCULAR
  Filled 2017-04-29: qty 1

## 2017-04-29 MED ORDER — OXYCODONE-ACETAMINOPHEN 5-325 MG PO TABS
1.0000 | ORAL_TABLET | Freq: Once | ORAL | Status: AC
  Administered 2017-04-29: 1 via ORAL
  Filled 2017-04-29: qty 1

## 2017-04-29 MED ORDER — CYCLOBENZAPRINE HCL 10 MG PO TABS
10.0000 mg | ORAL_TABLET | Freq: Once | ORAL | Status: AC
  Administered 2017-04-29: 10 mg via ORAL
  Filled 2017-04-29: qty 1

## 2017-04-29 MED ORDER — CYCLOBENZAPRINE HCL 10 MG PO TABS: 10.0000 mg | ORAL_TABLET | Freq: Two times a day (BID) | ORAL | 0 refills | Status: DC | PRN

## 2017-04-29 NOTE — ED Triage Notes (Signed)
Per EMS right leg pain, and lower back pain. 3 weeks ago seen by PCP diagnosed with sciatica, getting worse since Saturday,. Today wasnt able to work or walk much. VS per [facility] B/P 138/88, HR: 70 Resp:16

## 2017-04-29 NOTE — Discharge Instructions (Signed)
We suspect your symptoms are due to a radicular sciatic nerve pain coming from her lumbar spine.  You did not have any red flags for cord compression or injury on exam today. Your CT scan did not show acute fracture or dislocation however it showed some arthritis.  Please follow-up with a spine doctor and a primary doctor for further pain management and further management.  Please use the pain medicine and muscle relaxant to help with your symptoms.  If any symptoms change or worsen, please return to the nearest emergency department.

## 2017-04-29 NOTE — ED Provider Notes (Signed)
[facility] EMERGENCY DEPARTMENT Provider Note   CSN: [id number] Arrival date & time: 04/29/17  1746     History   Chief Complaint No chief complaint on file.   HPI Jennifer Pugh is a 52 y.o. female.  The history is provided by the patient, the spouse and medical records. No language interpreter was used.  Back Pain   This is a recurrent problem. The current episode started more than 1 week ago. The problem occurs constantly. The problem has not changed since onset.The pain is associated with no known injury. The pain is present in the lumbar spine. The quality of the pain is described as stabbing. The pain radiates to the right knee and right thigh. The pain is at a severity of 10/10. The pain is severe. The symptoms are aggravated by certain positions and bending. The pain is the same all the time. Pertinent negatives include no chest pain, no fever, no numbness, no weight loss, no headaches, no abdominal pain, no abdominal swelling, no bowel incontinence, no perianal numbness, no bladder incontinence, no dysuria, no paresthesias, no paresis and no weakness. She has tried muscle relaxants and analgesics for the symptoms. The treatment provided moderate relief. Risk factors include obesity.    Past Medical History:  Diagnosis Date  . Hypertension   . Sciatica   . Sleep apnea    CPAP use every night    There are no active problems to display for this patient.   Past Surgical History:  Procedure Laterality Date  . NO PAST SURGERIES    . SALPINGOOPHORECTOMY  06/25/2011   Procedure: SALPINGO OOPHERECTOMY;  Surgeon: Logan Bores, MD;  Location: [facility];  Service: Gynecology;  Laterality: Right;  . SUPRACERVICAL ABDOMINAL HYSTERECTOMY  06/25/2011   Procedure: HYSTERECTOMY SUPRACERVICAL ABDOMINAL;  Surgeon: Logan Bores, MD;  Location: [facility];  Service: Gynecology;;    OB History    No data available       Home Medications    Prior to Admission  medications   Medication Sig Start Date End Date Taking? Authorizing Provider  lisinopril (PRINIVIL,ZESTRIL) 40 MG tablet Take 40 mg by mouth every morning.   Yes [provider]  triamterene-hydrochlorothiazide (MAXZIDE-25) 37.5-25 MG per tablet Take 1 tablet by mouth every morning.    [provider]    Family History History reviewed. No pertinent family history.  Social History Social History   Tobacco Use  . Smoking status: Never Smoker  . Smokeless tobacco: Never Used  Substance Use Topics  . Alcohol use: No  . Drug use: No     Allergies   Patient has no known allergies.   Review of Systems Review of Systems  Constitutional: Negative for chills, diaphoresis, fatigue, fever and weight loss.  HENT: Negative for congestion.   Respiratory: Negative for cough, chest tightness, shortness of breath and wheezing.   Cardiovascular: Negative for chest pain.  Gastrointestinal: Negative for abdominal pain, bowel incontinence, constipation, diarrhea, nausea and vomiting.  Genitourinary: Negative for bladder incontinence, difficulty urinating, dysuria, flank pain and frequency.  Musculoskeletal: Positive for back pain. Negative for neck pain and neck stiffness.  Skin: Negative for rash and wound.  Neurological: Negative for dizziness, weakness, light-headedness, numbness, headaches and paresthesias.  Psychiatric/Behavioral: Negative for agitation and confusion.  All other systems reviewed and are negative.    Physical Exam Updated Vital Signs BP (!) 135/55 (BP Location: Left Arm)   Pulse 67   Temp 97.9 F (36.6 C) (Oral)  Resp 18   Ht 5' 2.5" (1.588 m)   Wt (!) 142.9 kg (315 lb)   LMP 06/18/2011   SpO2 97%   BMI 56.70 kg/m   Physical Exam  Constitutional: She is oriented to person, place, and time. She appears well-developed and well-nourished. No distress.  HENT:  Head: Normocephalic.  Nose: Nose normal.  Mouth/Throat: Oropharynx is clear and  moist. No oropharyngeal exudate.  Eyes: Conjunctivae and EOM are normal. Pupils are equal, round, and reactive to light.  Neck: Normal range of motion.  Cardiovascular: Normal rate and intact distal pulses.  No murmur heard. Pulmonary/Chest: Effort normal. No stridor. No respiratory distress. She has no wheezes. She has no rales. She exhibits no tenderness.  Abdominal: Soft. Bowel sounds are normal. She exhibits no distension. There is no tenderness.  Musculoskeletal: She exhibits tenderness. She exhibits no edema or deformity.       Lumbar back: She exhibits tenderness and pain. She exhibits no laceration.       Back:  Neurological: She is alert and oriented to person, place, and time. No sensory deficit. She exhibits normal muscle tone.  Skin: Capillary refill takes less than 2 seconds. No rash noted. She is not diaphoretic.  Psychiatric: She has a normal mood and affect.  Nursing note and vitals reviewed.    ED Treatments / Results  Labs (all labs ordered are listed, but only abnormal results are displayed) Labs Reviewed - No data to display  EKG  EKG Interpretation None       Radiology Ct Lumbar Spine Wo Contrast  Result Date: 04/29/2017 CLINICAL DATA:  Low back pain with bilateral sciatica EXAM: CT LUMBAR SPINE WITHOUT CONTRAST TECHNIQUE: Multidetector CT imaging of the lumbar spine was performed without intravenous contrast administration. Multiplanar CT image reconstructions were also generated. COMPARISON:  None. FINDINGS: Segmentation: 5 lumbar type vertebrae. Alignment: Normal. Vertebrae: No acute fracture or focal pathologic process. Paraspinal and other soft tissues: Negative. Disc levels: Moderate L3-4 and severe L4-5 and L5-S1 facet hypertrophy. No spinal canal or neural foraminal stenosis. IMPRESSION: Moderate-to-severe lower lumbar facet arthrosis without associated spinal canal or neural foraminal stenosis. No acute fracture or static subluxation of the lumbar spine.  Electronically Signed   By: Ulyses Jarred M.D.   On: 04/29/2017 19:02    Procedures Procedures (including critical care time)  Medications Ordered in ED Medications  morphine 4 MG/ML injection 4 mg (4 mg Intramuscular Given 04/29/17 1831)  oxyCODONE-acetaminophen (PERCOCET/ROXICET) 5-325 MG per tablet 1 tablet (1 tablet Oral Given 04/29/17 2132)  cyclobenzaprine (FLEXERIL) tablet 10 mg (10 mg Oral Given 04/29/17 2132)     Initial Impression / Assessment and Plan / ED Course  I have reviewed the triage vital signs and the nursing notes.  Pertinent labs & imaging results that were available during my care of the patient were reviewed by me and considered in my medical decision making (see chart for details).     Jennifer Pugh is a 52 y.o. female with a past medical history significant for hypertension, sleep apnea, and right-sided sciatic nerve pain diagnosed 2 months ago who presents with worsened right low back pain rating down her leg.  Patient says that she had no trauma causing her symptoms that began 2 months ago.  She has been seen several times and each time was diagnosed with sciatic pain.  She was started on steroids and given pain medicine and muscle relaxants.  She reports that the symptoms have improved after treatment but it  is persistent.  She reports that she was having pain with ambulation that was worsening today prompt her to seek evaluation.  She describes it as a sharp pain in the right low back that radiates down the back of her leg.  She says is worsened when she tries to lift her leg.  She denies any bowel or bladder incontinence, and denies any numbness, tingling, or weakness of the leg.  She denies any abdominal pain, chest pain, or other symptoms.  She denies IV drug use.  She denies any midline back pain.  She has not seen a back doctor for this.  She denies any other symptoms including no urinary symptoms, nausea, vomiting, or other complaints.  On exam, right leg  straight leg raise test was positive.  Patient had pain rating down the back of her leg all the way towards her heel.  Normal sensation and strength in the legs.  No pain in the right hip with lateral leg manipulation.  Doubt hip involvement.  Patient has some tenderness in her right low back with palpation.  No midline tenderness.  Exam otherwise unremarkable with clear lungs and nontender chest or abdomen.  No CVA tenderness present.  Patient had normal pulses and capillary refill in her legs.  As this is the fourth visit patient has been seen for her radicular sciatic type low back pain, and patient has not had any imaging during any of her visits, patient will have a CT scan to look for gross abnormalities, dislocation, or fracture.  I suspect patient is having a radicular pain causing her sciatica and will need to follow-up with a back surgeon.    Patient was given pain medication during her initial workup.  If CT does not show any severe concerning of normalities, anticipate patient will be discharged with pain medication, muscle relaxants which have helped previously and instructions to follow-up with a spine doctor as she has now failed outpatient management several times.  CT showed arthritis but otherwise no evidence of acute fracture or dislocation.    Patient felt better after pain medicine.  Patient will be given her pain medicine and muscle relaxant and will follow up with a spine doctor and her PCP for ongoing management.  Given the lack of red flags, do not feel patient needs transfer for MRI.  Patient and family agreed with return precautions for any new or worsening symptoms including red flags for cauda equina.  Patient will follow up with the spine doctor.  Patient had no other questions or concerns and was discharged in good condition.   Final Clinical Impressions(s) / ED Diagnoses   Final diagnoses:  Sciatica of right side  Acute right-sided low back pain with right-sided  sciatica  Arthritis, lumbar spine    ED Discharge Orders        Ordered    oxyCODONE-acetaminophen (PERCOCET/ROXICET) 5-325 MG tablet  Every 4 hours PRN     04/29/17 2143    cyclobenzaprine (FLEXERIL) 10 MG tablet  2 times daily PRN     04/29/17 2143      Clinical Impression: 1. Sciatica of right side   2. Acute right-sided low back pain with right-sided sciatica   3. Arthritis, lumbar spine     Disposition: Discharge  Condition: Good  I have discussed the results, Dx and Tx plan with the pt(& family if present). He/she/they expressed understanding and agree(s) with the plan. Discharge instructions discussed at great length. Strict return precautions discussed and pt &/or  family have verbalized understanding of the instructions. No further questions at time of discharge.    New Prescriptions   CYCLOBENZAPRINE (FLEXERIL) 10 MG TABLET    Take 1 tablet (10 mg total) by mouth 2 (two) times daily as needed for muscle spasms.   OXYCODONE-ACETAMINOPHEN (PERCOCET/ROXICET) 5-325 MG TABLET    Take 1 tablet by mouth every 4 (four) hours as needed for severe pain.    Follow Up: Pa, [facility] [address] [place] [address] [phone]     [facility] [address] [id number] [place] [id number]       Denim Start, Gwenyth Allegra, MD 04/30/17 [phone]

## 2017-04-29 NOTE — ED Notes (Signed)
Pt verbalizes understanding of dc instructions and denies any further needs this time 

## 2017-06-13 ENCOUNTER — Emergency Department (HOSPITAL_BASED_OUTPATIENT_CLINIC_OR_DEPARTMENT_OTHER)
Admission: EM | Admit: 2017-06-13 | Discharge: 2017-06-14 | Disposition: A | Discharge status: Home / Self Care | Payer: 59 | Attending: Emergency Medicine | Admitting: Emergency Medicine

## 2017-06-13 DIAGNOSIS — M5432 Sciatica, left side: Secondary | ICD-10-CM | POA: Diagnosis not present

## 2017-06-13 DIAGNOSIS — Z79899 Other long term (current) drug therapy: Secondary | ICD-10-CM | POA: Insufficient documentation

## 2017-06-13 DIAGNOSIS — I10 Essential (primary) hypertension: Secondary | ICD-10-CM | POA: Insufficient documentation

## 2017-06-13 DIAGNOSIS — R1032 Left lower quadrant pain: Secondary | ICD-10-CM | POA: Diagnosis present

## 2017-06-13 NOTE — ED Triage Notes (Signed)
Pt brought to ED by [facility] from home for c/o lower back pain radiating to her legs since last Wednesday, not getting better with Oxycodone and Flexeril, last dose of both last night at 1900 with no relief. BP 122/80, HR 98, R20 SPO2 96% RA.

## 2017-06-14 ENCOUNTER — Encounter (HOSPITAL_BASED_OUTPATIENT_CLINIC_OR_DEPARTMENT_OTHER): Payer: Self-pay | Admitting: Emergency Medicine

## 2017-06-14 ENCOUNTER — Other Ambulatory Visit: Payer: Self-pay

## 2017-06-14 LAB — URINALYSIS, ROUTINE W REFLEX MICROSCOPIC
Bilirubin Urine: NEGATIVE
Glucose, UA: NEGATIVE mg/dL
Ketones, ur: NEGATIVE mg/dL
Leukocytes, UA: NEGATIVE
Nitrite: NEGATIVE
Protein, ur: NEGATIVE mg/dL
Specific Gravity, Urine: 1.02 (ref 1.005–1.030)
pH: 5.5 (ref 5.0–8.0)

## 2017-06-14 LAB — URINALYSIS, MICROSCOPIC (REFLEX)

## 2017-06-14 MED ORDER — HYDROMORPHONE HCL 1 MG/ML IJ SOLN
2.0000 mg | Freq: Once | INTRAMUSCULAR | Status: AC
  Administered 2017-06-14: 2 mg via INTRAMUSCULAR
  Filled 2017-06-14: qty 2

## 2017-06-14 MED ORDER — OXYCODONE-ACETAMINOPHEN 5-325 MG PO TABS: 1.0000 | ORAL_TABLET | ORAL | 0 refills | Status: AC | PRN

## 2017-06-14 MED ORDER — ONDANSETRON 8 MG PO TBDP
8.0000 mg | ORAL_TABLET | Freq: Once | ORAL | Status: AC
  Administered 2017-06-14: 8 mg via ORAL
  Filled 2017-06-14: qty 1

## 2017-06-14 NOTE — ED Provider Notes (Signed)
[facility] Provider Note: Jennifer Spurling, MD, FACEP  CSN: [id number] MRN: [id number] ARRIVAL: 06/13/17 at 2357 ROOM: [place]  06/14/17 4:23 AM Jennifer Pugh is a 52 y.o. female with a history of sciatica.  She was seen in the ED and March for right sided sciatica.  She is now having symptoms consistent with left-sided sciatica.  This began 3 days ago.  The pain is located in her left paralumbar region radiating down her left leg.  She has some paresthesias of the left thigh.  She is having difficulty ambulating due to the pain.  She is not sure if that leg is actually weak because any weightbearing or movement is painful.  She denies bladder or bowel changes.  She is rating her pain is severe at its worst, worse with movement of the left leg or attempted weightbearing as noted above.  Consultation with the [facility] state controlled substances database reveals the patient has received 4 opioid prescriptions in the past 2 years..   Past Medical History:  Diagnosis Date  . Hypertension   . Sciatica   . Sleep apnea    CPAP use every night    Past Surgical History:  Procedure Laterality Date  . NO PAST SURGERIES    . SALPINGOOPHORECTOMY  06/25/2011   Procedure: SALPINGO OOPHERECTOMY;  Surgeon: Logan Bores, MD;  Location: [facility];  Service: Gynecology;  Laterality: Right;  . SUPRACERVICAL ABDOMINAL HYSTERECTOMY  06/25/2011   Procedure: HYSTERECTOMY SUPRACERVICAL ABDOMINAL;  Surgeon: Logan Bores, MD;  Location: [facility];  Service: Gynecology;;    No family history on file.  Social History   Tobacco Use  . Smoking status: Never Smoker  . Smokeless tobacco: Never Used  Substance Use Topics  . Alcohol use: No  . Drug use: No    Prior to Admission medications   Medication Sig Start Date End Date Taking? Authorizing Provider  cyclobenzaprine (FLEXERIL) 10 MG tablet Take 1 tablet (10 mg total)  by mouth 2 (two) times daily as needed for muscle spasms. 04/29/17   Tegeler, Gwenyth Allegra, MD  lisinopril (PRINIVIL,ZESTRIL) 40 MG tablet Take 40 mg by mouth every morning.    [provider]  oxyCODONE-acetaminophen (PERCOCET/ROXICET) 5-325 MG tablet Take 1 tablet by mouth every 4 (four) hours as needed for severe pain. 04/29/17   Tegeler, Gwenyth Allegra, MD  triamterene-hydrochlorothiazide (MAXZIDE-25) 37.5-25 MG per tablet Take 1 tablet by mouth every morning.    [provider]    Allergies Patient has no known allergies.   REVIEW OF SYSTEMS  Negative except as noted here or in the History of Present Illness.   PHYSICAL EXAMINATION  Initial Vital Signs Blood pressure 125/85, pulse 97, temperature 98.3 F (36.8 C), temperature source Oral, resp. rate 18, height 5' 2.5" (1.588 m), weight (!) 145.2 kg (320 lb), last menstrual period 06/18/2011.  Examination General: Well-developed, obese female in no acute distress; appearance consistent with age of record HENT: normocephalic; atraumatic Eyes: pupils equal, round and reactive to light; extraocular muscles intact Neck: supple Heart: regular rate and rhythm Lungs: clear to auscultation bilaterally Abdomen: soft; nondistended; nontender; bowel sounds present Back: Positive straight leg raise on the left at 10 degrees Extremities: No deformity; pulses normal Neurologic: Awake, alert and oriented; motor function intact in all extremities and symmetric except strength occult to assess in the left lower extremity due to pain with any attempted movement; sensation  intact and symmetric in the lower extremities except for left lateral thigh; no facial droop Skin: Warm and dry Psychiatric: Normal mood and affect   RESULTS  Summary of this visit's results, reviewed by myself:   EKG Interpretation  Date/Time:    Ventricular Rate:    PR Interval:    QRS Duration:   QT Interval:    QTC Calculation:   R Axis:     Text  Interpretation:        Laboratory Studies: Results for orders placed or performed during the hospital encounter of 06/13/17 (from the past 24 hour(s))  Urinalysis, Routine w reflex microscopic     Status: Abnormal   Collection Time: 06/14/17  1:43 AM  Result Value Ref Range   Color, Urine YELLOW YELLOW   APPearance HAZY (A) CLEAR   Specific Gravity, Urine 1.020 1.005 - 1.030   pH 5.5 5.0 - 8.0   Glucose, UA NEGATIVE NEGATIVE mg/dL   Hgb urine dipstick TRACE (A) NEGATIVE   Bilirubin Urine NEGATIVE NEGATIVE   Ketones, ur NEGATIVE NEGATIVE mg/dL   Protein, ur NEGATIVE NEGATIVE mg/dL   Nitrite NEGATIVE NEGATIVE   Leukocytes, UA NEGATIVE NEGATIVE  Urinalysis, Microscopic (reflex)     Status: Abnormal   Collection Time: 06/14/17  1:43 AM  Result Value Ref Range   RBC / HPF 0-5 0 - 5 RBC/hpf   WBC, UA 11-20 0 - 5 WBC/hpf   Bacteria, UA MANY (A) NONE SEEN   Squamous Epithelial / LPF 0-5 0 - 5   Mucus PRESENT    WBC Casts, UA PRESENT    Imaging Studies: No results found.  ED COURSE and MDM  Nursing notes and initial vitals signs, including pulse oximetry, reviewed.  Vitals:   06/14/17 0002 06/14/17 0003  BP: 125/85   Pulse: 97   Resp: 18   Temp: 98.3 F (36.8 C)   TempSrc: Oral   Weight:  (!) 145.2 kg (320 lb)  Height:  5' 2.5" (1.588 m)   We will give the patient IM injection of Dilaudid for treatment of her acute pain and provide another prescription for oxycodone.  She has already seen a back specialist and has discussed the need for an MRI if symptoms return or persist.   PROCEDURES    ED DIAGNOSES     ICD-10-CM   1. Sciatica of left side M54.32        Jennifer Pugh, Jenny Reichmann, MD 06/14/17 [phone]

## 2017-06-14 NOTE — ED Notes (Signed)
Pt requesting for help to use the bathroom when attempted to help her with the back of the stretcher to lift her up, pt states she needs to have help to pull her up out of bed, pt and family oriented that we can use the equipment to help her up but we can't pull her with our body secondary to having same back problems, pt oriented of how to pushup from the bed while we help with the equipment, family and pt got upset about no been able to have use to help her lift her up with our body. CN notified about the situation. Bedside commode used and pt assisted to bed after using the bathroom.

## 2018-09-05 IMAGING — CT CT L SPINE W/O CM
3 series · 11 of 33 positions shown, 13 images · non-contrast
Comparison: None.

CLINICAL DATA: Low back pain with bilateral sciatica

EXAM:
CT LUMBAR SPINE WITHOUT CONTRAST
TECHNIQUE: Multidetector CT imaging of the lumbar spine was performed without
intravenous contrast administration. Multiplanar CT image
reconstructions were also generated.

[Series 4: l spine soft · axial · 0.29mm/px · z∈[+962,+1126]mm · 3 of 134 slices shown, 4 images]
[im 31/134  soft-tissue]
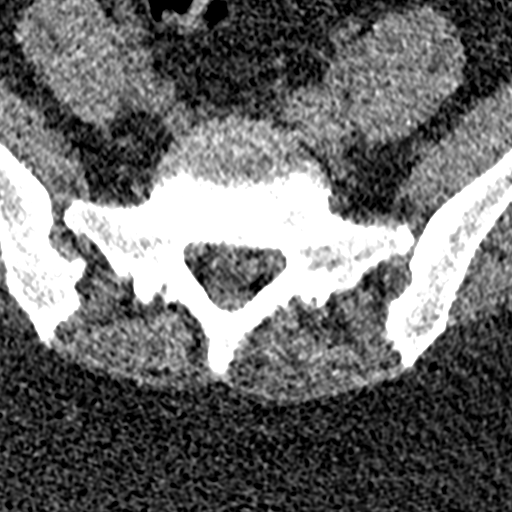
[im 31/134  bone]
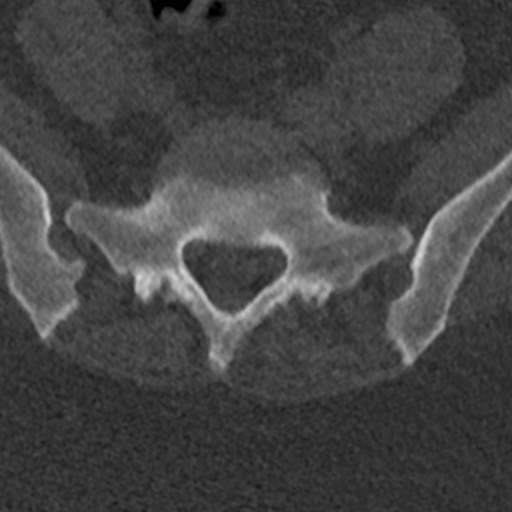
[im 72/134  bone]
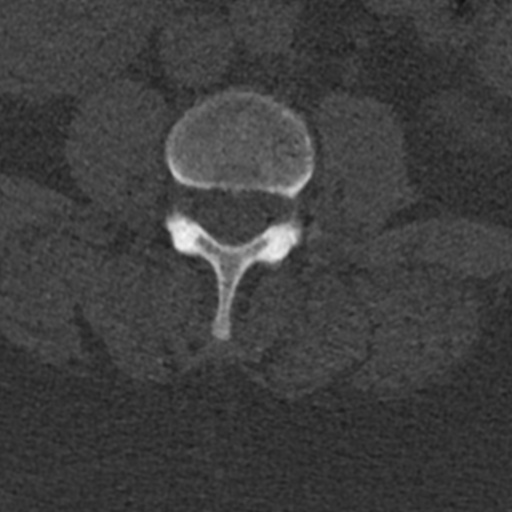
[im 113/134  bone]
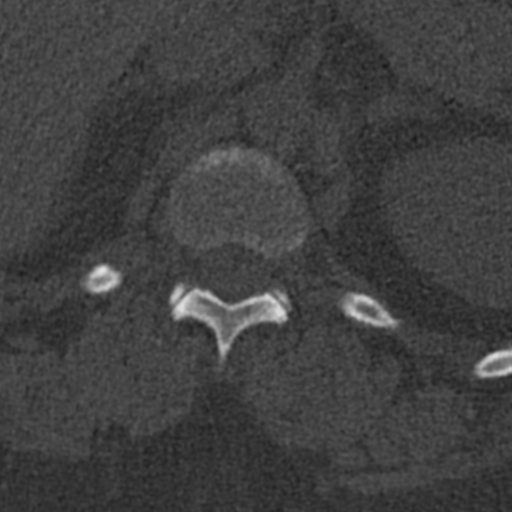

[Series 7: sagittal bone · sagittal · 0.27mm/px · 5 of 81 slices shown, 6 images]
[im 27/81  bone]
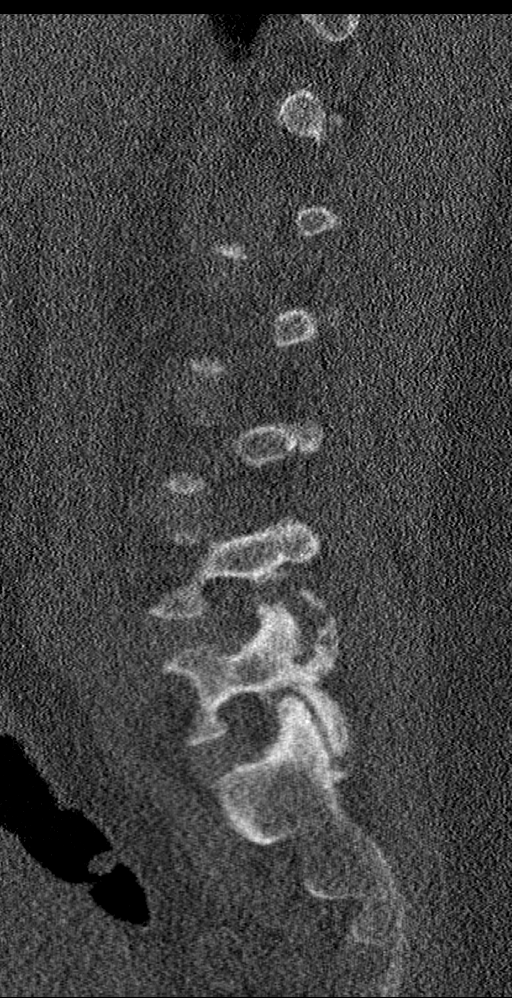
[im 34/81  bone]
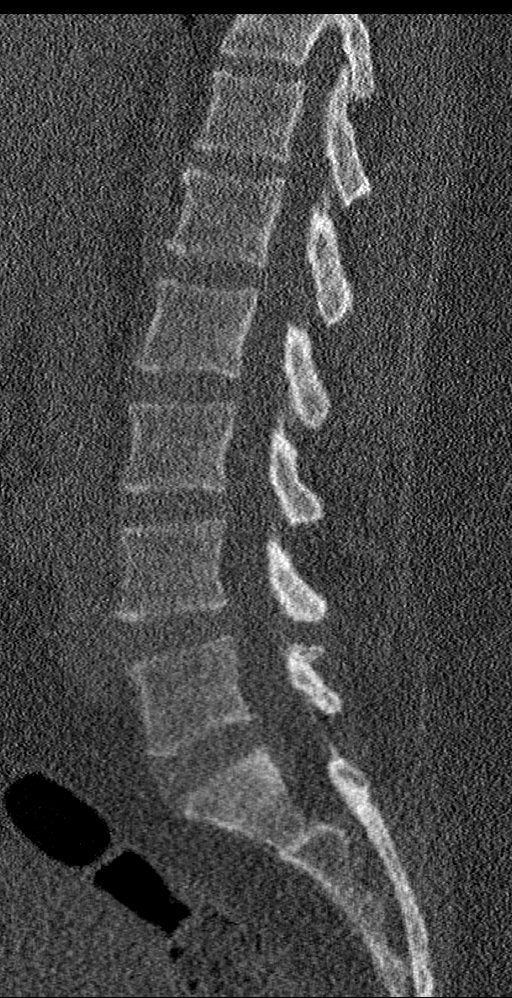
[im 41/81  soft-tissue]
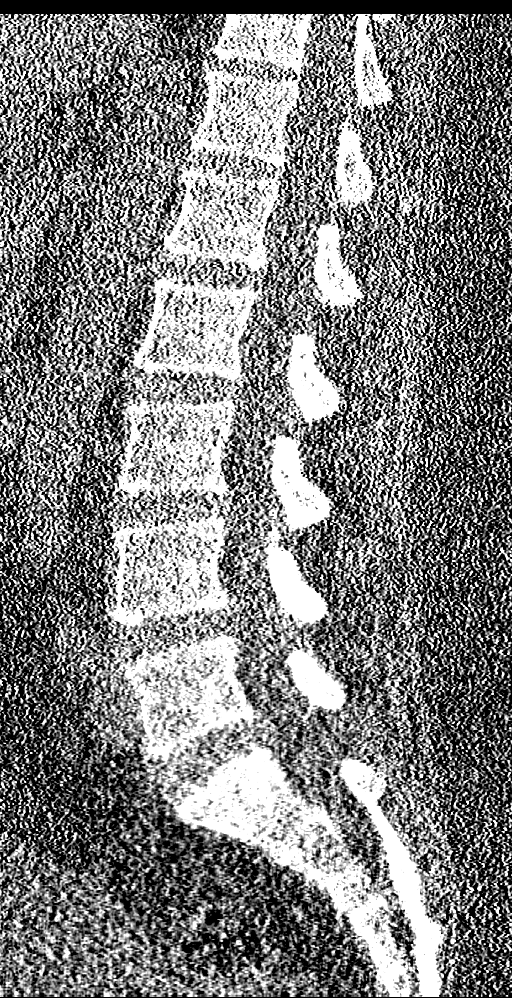
[im 41/81  bone]
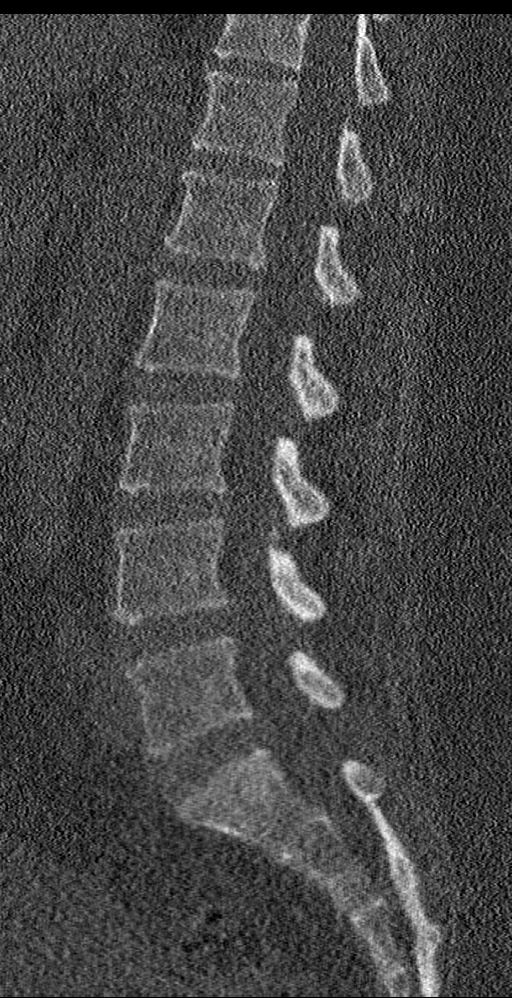
[im 47/81  bone]
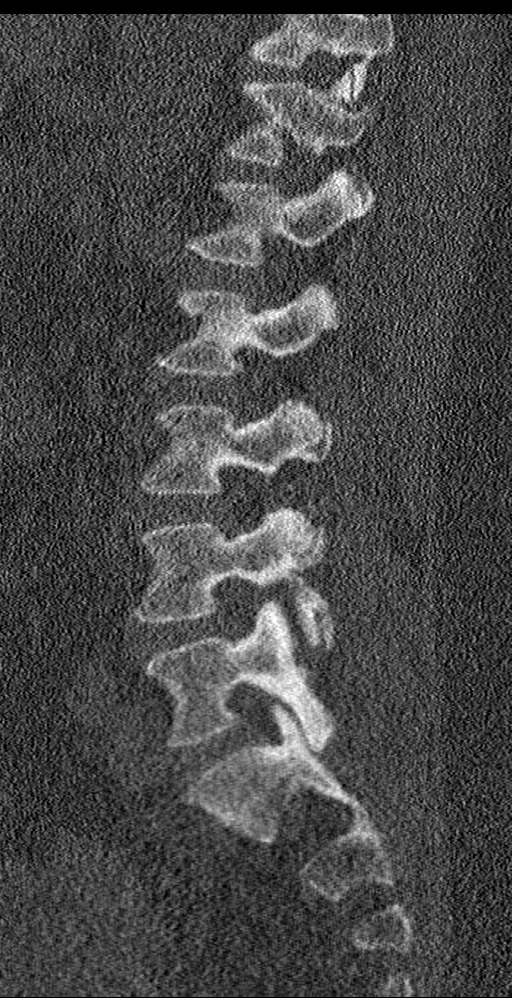
[im 54/81  bone]
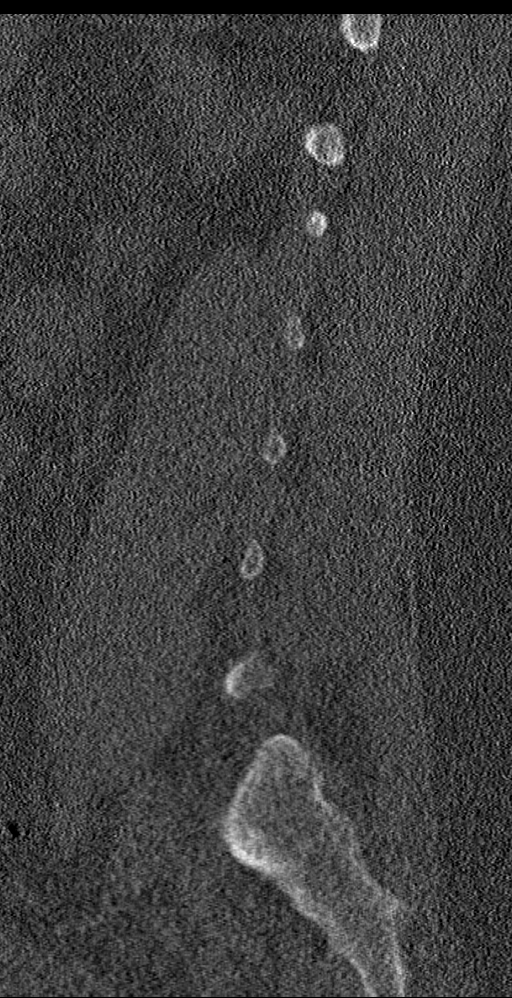

[Series 8: coronal bone · coronal · 0.31mm/px · 3 of 70 slices shown]
[im 14/70  bone]
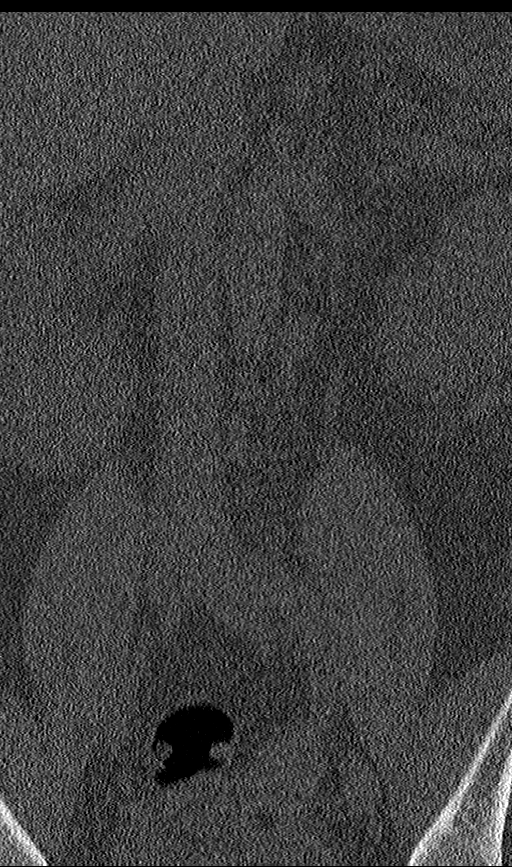
[im 28/70  bone]
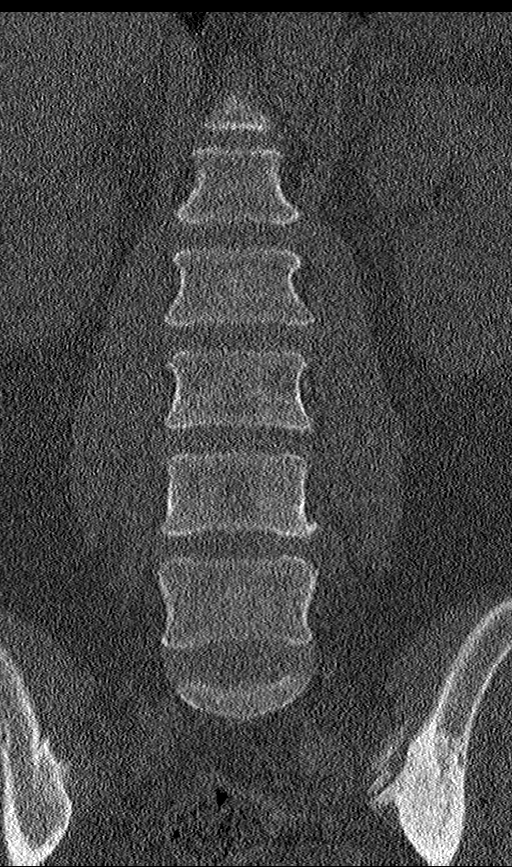
[im 42/70  bone]
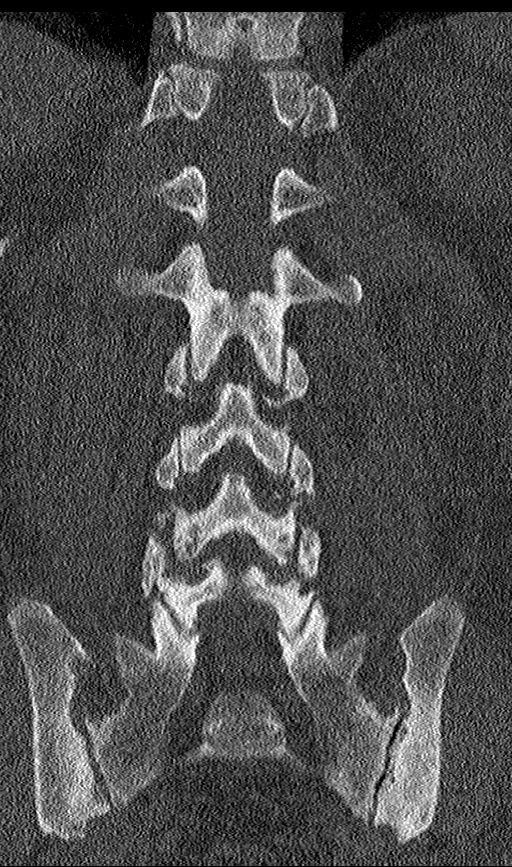

[11 of 33 positions shown; findings below may reference images not displayed]

FINDINGS: Segmentation: 5 lumbar type vertebrae.

Alignment: Normal.

Vertebrae: No acute fracture or focal pathologic process.

Paraspinal and other soft tissues: Negative.

Disc levels: Moderate L3-4 and severe L4-5 and L5-S1 facet
hypertrophy. No spinal canal or neural foraminal stenosis.
IMPRESSION: Moderate-to-severe lower lumbar facet arthrosis without associated
spinal canal or neural foraminal stenosis. No acute fracture or
static subluxation of the lumbar spine.

## 2022-05-29 ENCOUNTER — Other Ambulatory Visit (HOSPITAL_COMMUNITY): Payer: Self-pay | Admitting: Internal Medicine

## 2022-05-29 DIAGNOSIS — M25562 Pain in left knee: Secondary | ICD-10-CM
